# Patient Record
Sex: Male | Born: 1955 | Race: White | Hispanic: No | Marital: Married | Smoking: Never smoker
Health system: Southern US, Community
[De-identification: ages and names within clinical notes are randomized; demographics above are authoritative.]

---

## 2010-05-10 ENCOUNTER — Emergency Department (HOSPITAL_BASED_OUTPATIENT_CLINIC_OR_DEPARTMENT_OTHER): Admission: EM | Admit: 2010-05-10 | Discharge: 2010-05-10 | Payer: Self-pay | Admitting: Emergency Medicine

## 2010-09-08 LAB — URINALYSIS, ROUTINE W REFLEX MICROSCOPIC
Glucose, UA: NEGATIVE mg/dL
Ketones, ur: 15 mg/dL — AB
Specific Gravity, Urine: 1.033 — ABNORMAL HIGH (ref 1.005–1.030)
pH: 6 (ref 5.0–8.0)

## 2010-09-08 LAB — GC/CHLAMYDIA PROBE AMP, GENITAL: Chlamydia, DNA Probe: NEGATIVE

## 2010-09-08 LAB — URINE MICROSCOPIC-ADD ON

## 2010-09-08 LAB — URINE CULTURE: Culture  Setup Time: 201111132255

## 2018-06-25 ENCOUNTER — Emergency Department (HOSPITAL_BASED_OUTPATIENT_CLINIC_OR_DEPARTMENT_OTHER)
Admission: EM | Admit: 2018-06-25 | Discharge: 2018-06-25 | Disposition: A | Discharge status: Home / Self Care | Payer: 59 | Attending: Emergency Medicine | Admitting: Emergency Medicine

## 2018-06-25 ENCOUNTER — Emergency Department (HOSPITAL_BASED_OUTPATIENT_CLINIC_OR_DEPARTMENT_OTHER): Payer: 59

## 2018-06-25 ENCOUNTER — Other Ambulatory Visit: Payer: Self-pay

## 2018-06-25 ENCOUNTER — Encounter (HOSPITAL_BASED_OUTPATIENT_CLINIC_OR_DEPARTMENT_OTHER): Payer: Self-pay | Admitting: Emergency Medicine

## 2018-06-25 DIAGNOSIS — M25562 Pain in left knee: Secondary | ICD-10-CM | POA: Diagnosis not present

## 2018-06-25 DIAGNOSIS — R2242 Localized swelling, mass and lump, left lower limb: Secondary | ICD-10-CM | POA: Diagnosis not present

## 2018-06-25 MED ORDER — OXYCODONE-ACETAMINOPHEN 5-325 MG PO TABS
1.0000 | ORAL_TABLET | Freq: Once | ORAL | Status: AC
  Administered 2018-06-25: 1 via ORAL
  Filled 2018-06-25: qty 1

## 2018-06-25 MED ORDER — MELOXICAM 7.5 MG PO TABS: 7.5000 mg | ORAL_TABLET | Freq: Every day | ORAL | 0 refills | Status: AC

## 2018-06-25 MED ORDER — KETOROLAC TROMETHAMINE 30 MG/ML IJ SOLN
30.0000 mg | Freq: Once | INTRAMUSCULAR | Status: AC
Start: 2018-06-25 — End: 2018-06-25
  Administered 2018-06-25: 30 mg via INTRAMUSCULAR
  Filled 2018-06-25: qty 1

## 2018-06-25 NOTE — ED Provider Notes (Signed)
[facility] EMERGENCY DEPARTMENT Provider Note   CSN: [id number] Arrival date & time: 06/25/18  1038     History   Chief Complaint Chief Complaint  Patient presents with  . Back Pain  . Knee Pain    HPI Paul Brock is a 62 y.o. male  Who presents today for evaluation of back and knee pain.    He reports that a few weeks ago he lifted some heavy things and develioped left sided knee pain and back pain with no groin pain.    On the 25th he laid on a sofa and took a nap and woke up with bad left sided knee pain with less pain in his hip and back.   He went to see Dr. Aundria Rud at [facility].  He took back x-rays and gave him prednisone and flexeril with out relief.  He has some left over hydrocodone that doesn't help it.    He denies fevers, changes in appetite, or other body wide symptoms.      HPI  History reviewed. No pertinent past medical history.  There are no active problems to display for this patient.   History reviewed. No pertinent surgical history.      Home Medications    Prior to Admission medications   Medication Sig Start Date End Date Taking? Authorizing Provider  meloxicam (MOBIC) 7.5 MG tablet Take 1 tablet (7.5 mg total) by mouth daily. 06/25/18   Cristina Gong, PA-C    Family History No family history on file.  Social History Social History   Tobacco Use  . Smoking status: Never Smoker  . Smokeless tobacco: Never Used  Substance Use Topics  . Alcohol use: Yes  . Drug use: Never     Allergies   Penicillins   Review of Systems Review of Systems  Constitutional: Negative for chills and fever.  Musculoskeletal:       Knee pain  Skin: Negative for color change, rash and wound.     Physical Exam Updated Vital Signs BP 119/75   Pulse (!) 51   Temp 98.3 F (36.8 C) (Oral)   Resp 19   Ht 6\' 1"  (1.854 m)   Wt 96.2 kg   SpO2 97%   BMI 27.97 kg/m   Physical Exam Vitals signs and nursing note reviewed.    Constitutional:      General: He is not in acute distress.    Appearance: He is normal weight. He is not ill-appearing.  HENT:     Head: Normocephalic.  Cardiovascular:     Rate and Rhythm: Normal rate.     Pulses: Normal pulses.     Heart sounds: Normal heart sounds.     Comments: 2+ DP/PT pulses bilaterally.  Musculoskeletal:     Comments: Mild medial TTP over the left knee.  There is generalized pain with range of motion.  1+ edema to left lower extremity.  No pitting edema to right lower extremity.  No localized edema to left knee.  Skin:    General: Skin is warm and dry.     Comments: No abnormal redness, warmth, induration, or fluctuance to left knee.  No obvious skin breaks over left knee.  Neurological:     General: No focal deficit present.     Mental Status: He is alert.     Sensory: No sensory deficit.      ED Treatments / Results  Labs (all labs ordered are listed, but only abnormal results are displayed) Labs  Reviewed - No data to display  EKG None  Radiology [place] Venous Img Lower  Left (dvt Study)  Result Date: 06/25/2018 CLINICAL DATA:  Pain and swelling x4 days EXAM: LEFT LOWER EXTREMITY VENOUS DOPPLER ULTRASOUND TECHNIQUE: Gray-scale sonography with compression, as well as color and duplex ultrasound, were performed to evaluate the deep venous system from the level of the common femoral vein through the popliteal and proximal calf veins. COMPARISON:  None FINDINGS: Normal compressibility of the common femoral, superficial femoral, and popliteal veins, as well as the proximal calf veins. No filling defects to suggest DVT on grayscale or color Doppler imaging. Doppler waveforms show normal direction of venous flow, normal respiratory phasicity and response to augmentation. Visualized segments of the saphenous venous system normal in caliber and compressibility. Survey views of the contralateral common femoral vein are unremarkable. IMPRESSION: No femoropopliteal and  no calf DVT in the visualized calf veins. If clinical symptoms are inconsistent or if there are persistent or worsening symptoms, further imaging (possibly involving the iliac veins) may be warranted. Electronically Signed   By: Corlis Leak  Hassell M.D.   On: 06/25/2018 13:26   Dg Knee Complete 4 Views Left  Result Date: 06/25/2018 CLINICAL DATA:  Left knee pain for 4 days.  Swelling. EXAM: LEFT KNEE - COMPLETE 4+ VIEW COMPARISON:  None. FINDINGS: Multiple ossifications along the medial aspect of the right knee and these may be in the posterior soft tissues. Findings are probably related to old injury. The left knee is located without fracture. There may be a tiny suprapatellar joint effusion. Enthesopathic changes along the superior aspect of the patella. Spurring and degenerative changes at the patellofemoral compartment of the knee. IMPRESSION: No acute bone abnormality to left knee. Question minimal joint effusion. 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 Soft tissue ossifications along the medial aspect of the left knee compatible with old soft tissue or ligamentous injury. Electronically Signed   By: Richarda OverlieAdam  Henn M.D.   On: 06/25/2018 12:39    Procedures Procedures (including critical care time)  Medications Ordered in ED Medications  ketorolac (TORADOL) 30 MG/ML injection 30 mg (30 mg Intramuscular Given 06/25/18 1202)  oxyCODONE-acetaminophen (PERCOCET/ROXICET) 5-325 MG per tablet 1 tablet (1 tablet Oral Given 06/25/18 1202)     Initial Impression / Assessment and Plan / ED Course  I have reviewed the triage vital signs and the nursing notes.  Pertinent labs & imaging results that were available during my care of the patient were reviewed by me and considered in my medical decision making (see chart for details).    Paul Rudeimothy Maroney Presents with left knee pain after taking a nap while laying funny on a love seat consistent with a knee sprain/strain.  The affected knee is tender on the medial aspect.   X-rays were obtained with out acute abnormality. The skin is intact to the knee without abnormal erythema or pain out of proportion.  Exam not consistent with gout or septic arthritis . the foot is warm and well perfused with intact sensation.  Motor function is limited secondary to pain.  Patient given instructions for OTC pain medication, o he has a walker that he has been using, he was given the option for crutches with he declined.  He is given a knee immobilizer today.  OFfered oxycodone which patient refused.  Recommended OTC lidocaine patches.  He is given prescription for Mobic, advised not to take other NSAIDs and to monitor for signs of GI bleeding.  Chart review shows that  he has had labs in the past few months with normal kidney function.    Recommended returning to orthopedic.  Return precautions were discussed with patient who states their understanding.  At the time of discharge patient denied any unaddressed complaints or concerns.  Patient is agreeable for discharge home.   Final Clinical Impressions(s) / ED Diagnoses   Final diagnoses:  Acute pain of left knee    ED Discharge Orders         Ordered    meloxicam (MOBIC) 7.5 MG tablet  Daily     06/25/18 1354           Cristina GongHammond, Sola Margolis W, [place] JerseyPA-C 06/25/18 1537    Long, Arlyss RepressJoshua G, MD 06/25/18 [phone]

## 2018-06-25 NOTE — ED Notes (Signed)
Pt reports travelling a lot for work.

## 2018-06-25 NOTE — Discharge Instructions (Signed)
I have given you a prescription for Mobic (meloxicam) today.  Mobic is a NSAID medication and you should not take it with other NSAIDs.  Examples of other NSAIDS include motrin, ibuprofen, aleve, naproxen, and Voltaren.  Please monitor your bowel movements for dark, tarry, sticky stools. If you have any bowel movements like this you need to stop taking mobic and call your doctor as this may represent a stomach ulcer from taking NSAIDS.    Please take Tylenol (acetaminophen) to relieve your pain.  You may take tylenol, up to 1,000 mg (two extra strength pills).  Do not take more than 3,000 mg tylenol in a 24 hour period.  Please check all medication labels as many medications such as pain and cold medications may contain tylenol. Please do not drink alcohol while taking this medication.   

## 2018-06-25 NOTE — ED Triage Notes (Signed)
Low back pain radiating into his groin and L knee since christmas day. Saw ortho 2 days ago and was given muscle prednisone and a muscle relaxer. The pain has eased in his back but states the L knee pain is worse.

## 2020-11-01 IMAGING — US US EXTREM LOW VENOUS*L*
1 series · 14 of 24 positions shown · non-contrast
Comparison: None

CLINICAL DATA: Pain and swelling x4 days

EXAM:
LEFT LOWER EXTREMITY VENOUS DOPPLER ULTRASOUND
TECHNIQUE: Gray-scale sonography with compression, as well as color and duplex
ultrasound, were performed to evaluate the deep venous system from
the level of the common femoral vein through the popliteal and
proximal calf veins.

[Series 1: us extrem low venous*left* · 0.08mm/px · 14 of 47 slices shown]
[im 1/47]
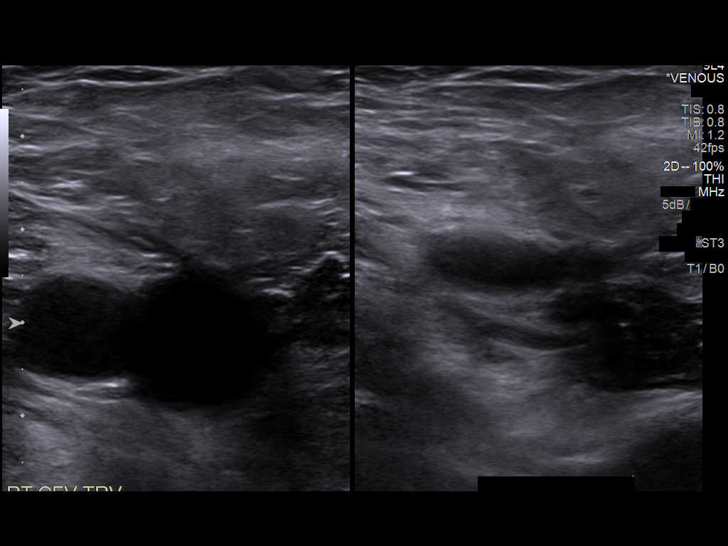
[im 5/47]
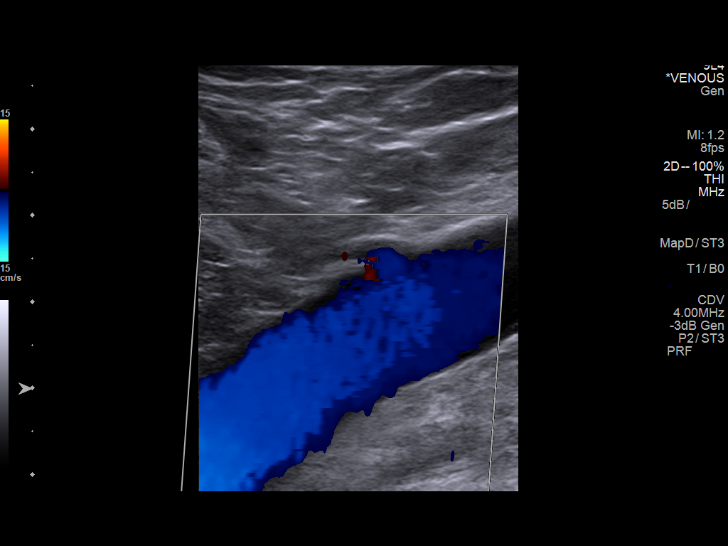
[im 9/47]
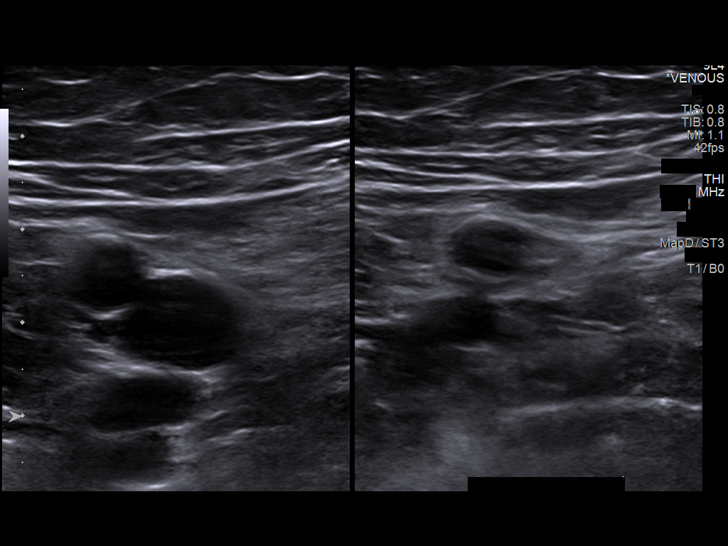
[im 13/47]
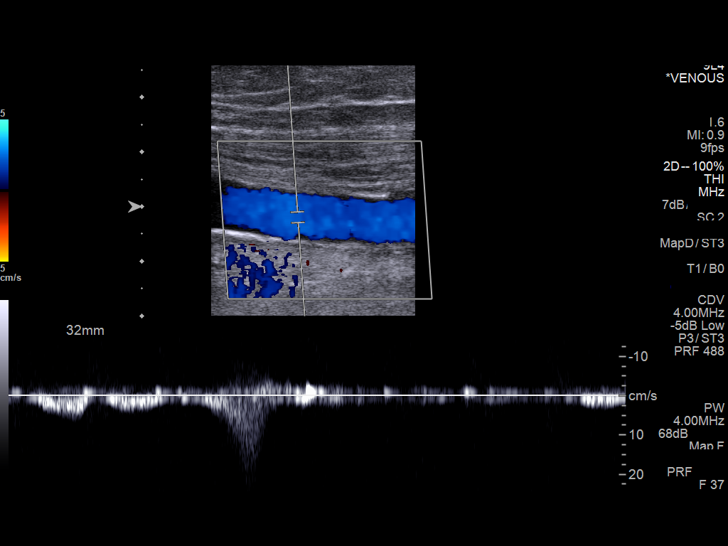
[im 15/47]
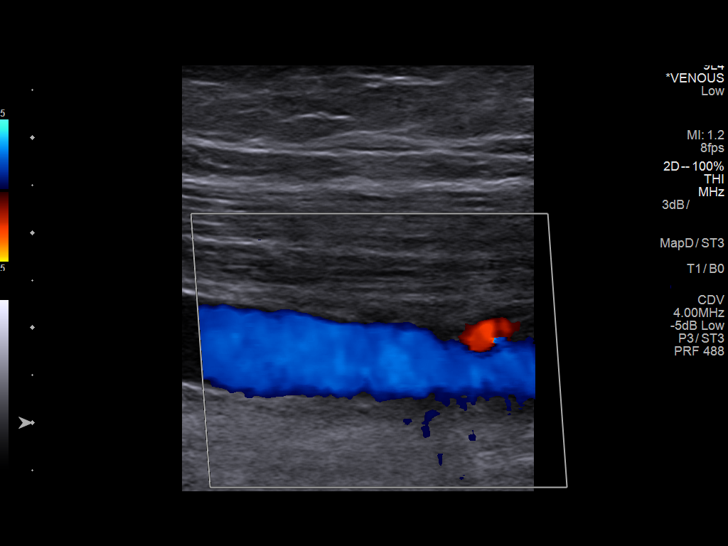
[im 19/47]
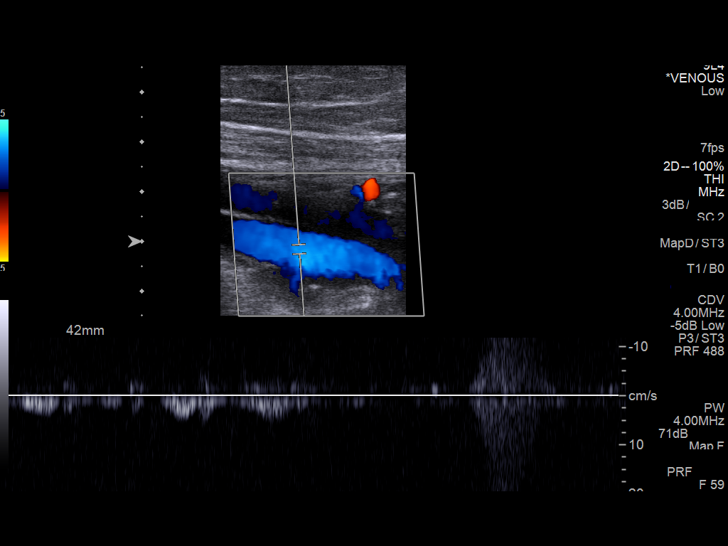
[im 23/47]
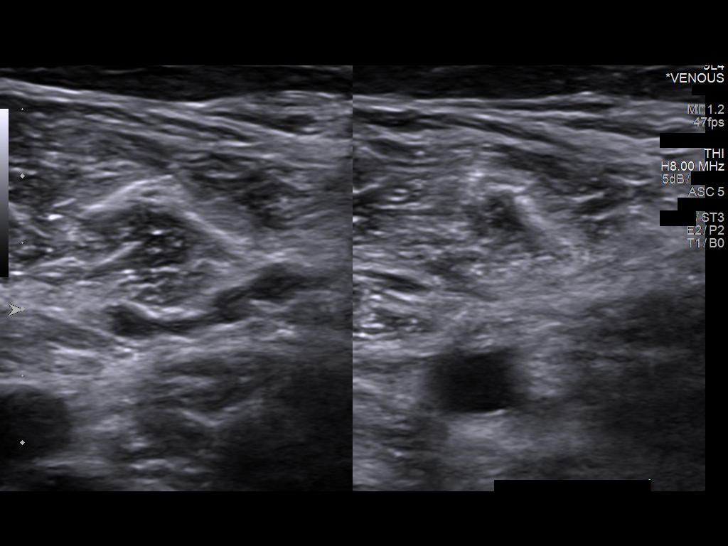
[im 25/47]
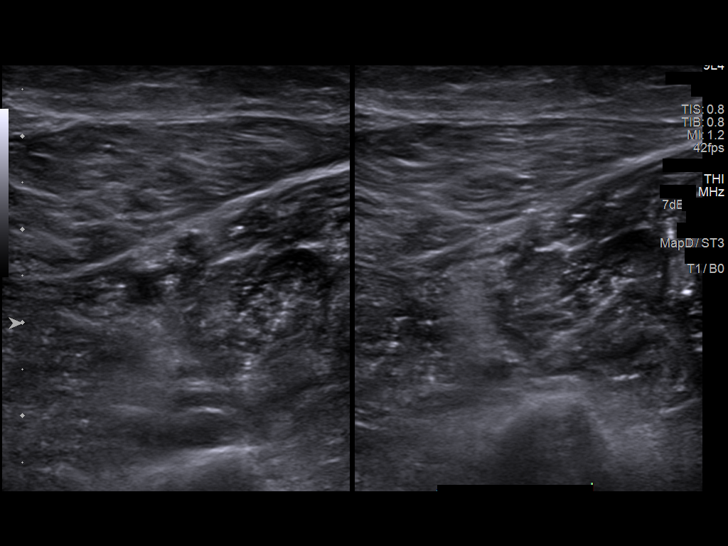
[im 29/47]
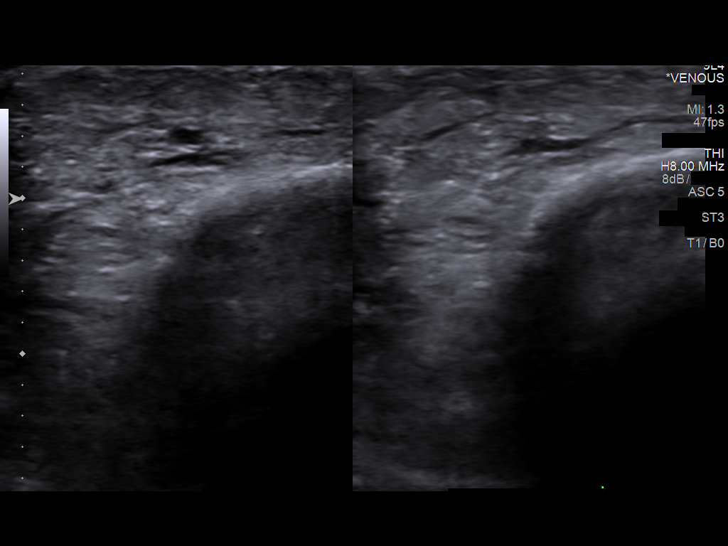
[im 33/47]
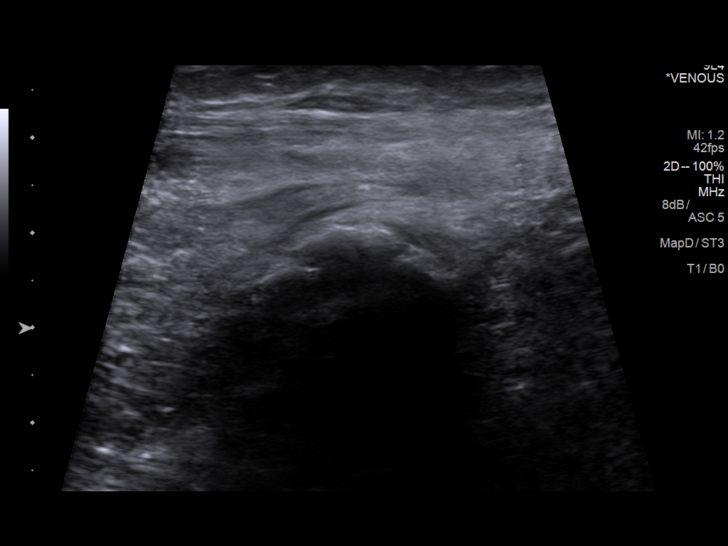
[im 37/47]
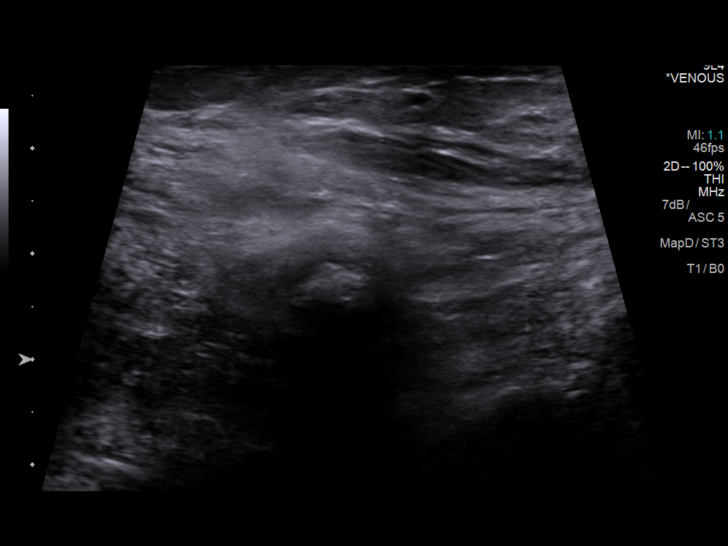
[im 39/47]
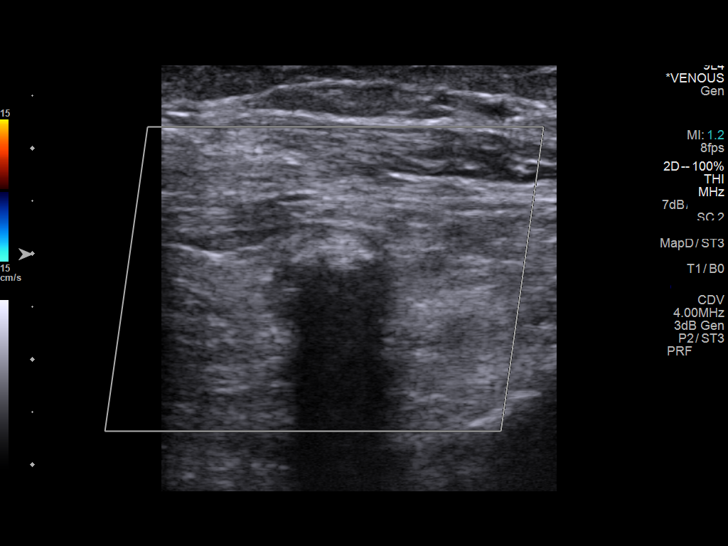
[im 43/47]
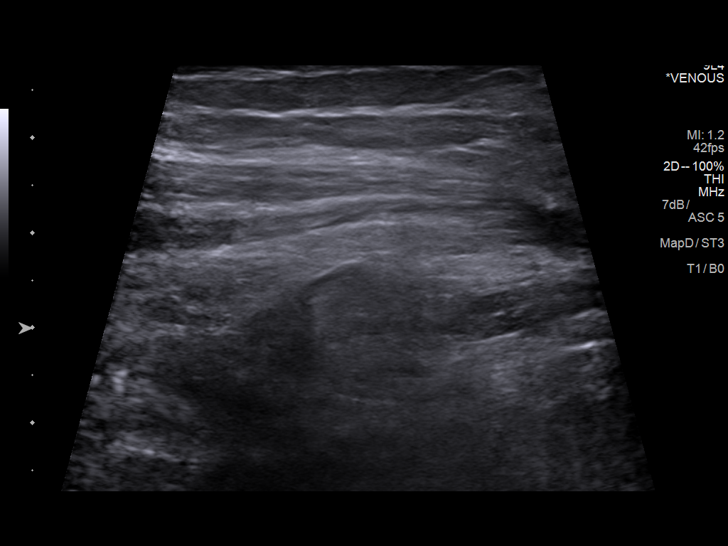
[im 47/47]
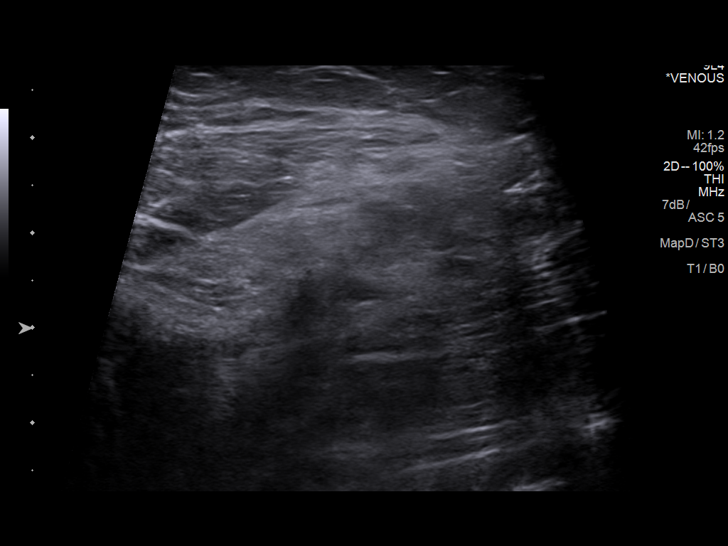

[14 of 24 positions shown; findings below may reference images not displayed]

FINDINGS: Normal compressibility of the common femoral, superficial femoral,
and popliteal veins, as well as the proximal calf veins. No filling
defects to suggest DVT on grayscale or color Doppler imaging.
Doppler waveforms show normal direction of venous flow, normal
respiratory phasicity and response to augmentation. Visualized
segments of the saphenous venous system normal in caliber and
compressibility. Survey views of the contralateral common femoral
vein are unremarkable.
IMPRESSION: No femoropopliteal and no calf DVT in the visualized calf veins. If
clinical symptoms are inconsistent or if there are persistent or
worsening symptoms, further imaging (possibly involving the iliac
veins) may be warranted.

## 2024-05-12 ENCOUNTER — Other Ambulatory Visit: Payer: Self-pay

## 2024-05-12 ENCOUNTER — Emergency Department (HOSPITAL_BASED_OUTPATIENT_CLINIC_OR_DEPARTMENT_OTHER)
Admission: EM | Admit: 2024-05-12 | Discharge: 2024-05-12 | Disposition: A | Discharge status: Home / Self Care | Payer: Self-pay | Attending: Emergency Medicine | Admitting: Emergency Medicine

## 2024-05-12 ENCOUNTER — Emergency Department (HOSPITAL_BASED_OUTPATIENT_CLINIC_OR_DEPARTMENT_OTHER): Payer: Self-pay

## 2024-05-12 ENCOUNTER — Encounter (HOSPITAL_BASED_OUTPATIENT_CLINIC_OR_DEPARTMENT_OTHER): Payer: Self-pay

## 2024-05-12 DIAGNOSIS — J069 Acute upper respiratory infection, unspecified: Secondary | ICD-10-CM | POA: Insufficient documentation

## 2024-05-12 DIAGNOSIS — R059 Cough, unspecified: Secondary | ICD-10-CM | POA: Diagnosis present

## 2024-05-12 LAB — RESP PANEL BY RT-PCR (RSV, FLU A&B, COVID)  RVPGX2
Influenza A by PCR: NEGATIVE
Influenza B by PCR: NEGATIVE
Resp Syncytial Virus by PCR: NEGATIVE
SARS Coronavirus 2 by RT PCR: NEGATIVE

## 2024-05-12 NOTE — ED Triage Notes (Signed)
 Cough and congestion x1 week. Pt states that his grandson was recently sick.

## 2024-05-12 NOTE — Discharge Instructions (Signed)
 You appear to have an upper respiratory infection (URI). An upper respiratory tract infection, or cold, is a viral infection of the air passages leading to the lungs. It should improve gradually after 5-7 days. You may have a lingering cough that lasts for 2- 4 weeks after the infection.  Your illness is contagious and can be spread to others. It cannot be cured by antibiotics or other medicines. Take basic precautions such as washing your hands often, covering your mouth when you cough or sneeze, and avoiding public places where you could spread your illness to others.   Your flu, covid, and RSV test were negative today.  Your chest x-ray did not show any signs of pneumonia.  Home care instructions:  You can take Tylenol  and/or Ibuprofen as directed on the packaging for fever reduction and pain relief.    For cough: honey 1/2 to 1 teaspoon (you can dilute the honey in water or another fluid).  You can also use dextromethorphan for cough which is an over-the-counter medication. You can use a humidifier for chest congestion and cough.  If you don't have a humidifier, you can sit in the bathroom with the hot shower running.      For sore throat: try warm salt water gargles, cepacol lozenges, throat spray, warm tea or water with lemon/honey, popsicles or ice, or OTC cold relief medicine for throat discomfort.    For congestion: Flonase (Fluticasone) 1-2 sprays in each nostril daily. This is an over the counter medication.    It is important to stay hydrated: drink plenty of fluids (water, gatorade/powerade/pedialyte, juices, or teas) to keep your throat moisturized and help further relieve irritation/discomfort.   Follow-up instructions: Please follow-up with your primary care provider for further evaluation of your symptoms if you are not feeling better within the next 5 days.   Return instructions:  Please return to the Emergency Department if you experience worsening symptoms.  RETURN  IMMEDIATELY IF you develop shortness of breath, confusion or altered mental status, a new rash, become dizzy, faint, or poorly responsive, or are unable to be cared for at home. Please return if you have persistent vomiting and cannot keep down fluids or develop a fever that is not controlled by tylenol  or motrin.   Please return if you have any other emergent concerns.

## 2024-05-12 NOTE — ED Provider Notes (Signed)
 [place] EMERGENCY DEPARTMENT AT [facility] Provider Note   CSN: [id number] Arrival date & time: 05/12/24  1038     Patient presents with: Cough and Nasal Congestion   Paul Brock is a 68 y.o. male with no significant past medical history presents with concern for a dry cough and congestion ongoing for the past week.  He denies any fever, chills, sore throat.  Denies any chest pain or shortness of breath.  Reports his grandson was recently sick with a runny nose.   HPI     Prior to Admission medications   Medication Sig Start Date End Date Taking? Authorizing Provider  meloxicam  (MOBIC ) 7.5 MG tablet Take 1 tablet (7.5 mg total) by mouth daily. 06/25/18   Windle Almarie ORN, PA-C    Allergies: Penicillins    Review of Systems  HENT:  Negative for sore throat.   Respiratory:  Positive for cough.     Updated Vital Signs BP (!) 132/90 (BP Location: Right Arm)   Pulse 65   Temp 98.8 F (37.1 C)   Resp 18   Ht 6' 1 (1.854 m)   Wt 92.1 kg   SpO2 96%   BMI 26.78 kg/m   Physical Exam Vitals and nursing note reviewed.  Constitutional:      General: He is not in acute distress.    Appearance: He is well-developed.  HENT:     Head: Normocephalic and atraumatic.     Nose: Congestion present.     Mouth/Throat:     Mouth: Mucous membranes are moist.     Pharynx: No oropharyngeal exudate or posterior oropharyngeal erythema.  Eyes:     Conjunctiva/sclera: Conjunctivae normal.  Cardiovascular:     Rate and Rhythm: Normal rate and regular rhythm.     Heart sounds: No murmur heard. Pulmonary:     Effort: Pulmonary effort is normal. No respiratory distress.     Breath sounds: Normal breath sounds.     Comments: Mild dry cough on exam Abdominal:     Palpations: Abdomen is soft.     Tenderness: There is no abdominal tenderness.  Musculoskeletal:        General: No swelling.     Cervical back: Neck supple.  Skin:    General: Skin is warm and dry.      Capillary Refill: Capillary refill takes less than 2 seconds.  Neurological:     Mental Status: He is alert.  Psychiatric:        Mood and Affect: Mood normal.     (all labs ordered are listed, but only abnormal results are displayed) Labs Reviewed  RESP PANEL BY RT-PCR (RSV, FLU A&B, COVID)  [id number]    EKG: None  Radiology: DG Chest 2 View Result Date: 05/12/2024 EXAM: 2 VIEW(S) XRAY OF THE CHEST 05/12/2024 11:27:00 AM COMPARISON: PA and lateral radiographs of the chest dated 05/18/2021. CLINICAL HISTORY: cough FINDINGS: LUNGS AND PLEURA: No focal pulmonary opacity. No pleural effusion. No pneumothorax. HEART AND MEDIASTINUM: No acute abnormality of the cardiac and mediastinal silhouettes. BONES AND SOFT TISSUES: Old left clavicular fracture. IMPRESSION: 1. No acute process. Electronically signed by: Evalene Coho MD 05/12/2024 11:44 AM EST RP Workstation: [id number]     Procedures   Medications Ordered in the ED - No data to display  Medical Decision Making Amount and/or Complexity of Data Reviewed Radiology: ordered.     Differential diagnosis includes but is not limited to COVID, flu, RSV, viral URI, strep pharyngitis, viral pharyngitis, allergic rhinitis, pneumonia, bronchitis   ED Course:  Upon initial evaluation, patient is well-appearing, no acute distress.  Normal vitals aside from elevated blood pressure 132/90 upon arrival.  Mild dry cough, but lungs clear to auscultation bilaterally.  Labs Ordered: I Ordered, and personally interpreted labs.  The pertinent results include:   COVID, flu, RSV negative  Imaging Studies ordered: I ordered imaging studies including chest x-ray I independently visualized the imaging with scope of interpretation limited to determining acute life threatening conditions related to emergency care. Imaging showed no acute abnormality I agree with the radiologist interpretation    Medications  Given: None  Upon re-evaluation, patient remains well-appearing with stable vitals.  COVID, flu, RSV testing is negative.  His chest x-ray shows no consolidations, and given no fever or adventitious lung sounds, low concern for pneumonia.  Low concern for CHF given no pulmonary edema noted, no edema of the extremities.  Symptoms of acute onset dry cough and congestion are consistent with viral URI.  Patient is stable and appropriate for discharge home    Impression: Viral URI  Disposition:  Discharged home with instructions to use over-the-counter medications as needed for symptom control.  Follow-up with PCP if symptoms not improving within the next 5 days. Return precautions given and patient verbalized understanding.    This chart was dictated using voice recognition software, Dragon. Despite the best efforts of this provider to proofread and correct errors, errors may still occur which can change documentation meaning.       Final diagnoses:  Viral URI with cough    ED Discharge Orders     None          Veta Palma, PA-C 05/12/24 1155    Dreama Longs, MD 05/12/24 1256
